# Patient Record
Sex: Female | Born: 1990 | Race: Black or African American | Hispanic: No | Marital: Single | Smoking: Never smoker
Health system: Southern US, Community
[De-identification: ages and names within clinical notes are randomized; demographics above are authoritative.]

---

## 2012-08-30 ENCOUNTER — Emergency Department (HOSPITAL_COMMUNITY)
Admission: EM | Admit: 2012-08-30 | Discharge: 2012-08-30 | Disposition: A | Discharge status: Home Health | Payer: No Typology Code available for payment source | Attending: Emergency Medicine | Admitting: Emergency Medicine

## 2012-08-30 ENCOUNTER — Emergency Department (HOSPITAL_COMMUNITY): Payer: No Typology Code available for payment source

## 2012-08-30 ENCOUNTER — Encounter (HOSPITAL_COMMUNITY): Payer: Self-pay | Admitting: Emergency Medicine

## 2012-08-30 DIAGNOSIS — Z79899 Other long term (current) drug therapy: Secondary | ICD-10-CM | POA: Insufficient documentation

## 2012-08-30 DIAGNOSIS — F411 Generalized anxiety disorder: Secondary | ICD-10-CM | POA: Insufficient documentation

## 2012-08-30 DIAGNOSIS — S161XXA Strain of muscle, fascia and tendon at neck level, initial encounter: Secondary | ICD-10-CM

## 2012-08-30 DIAGNOSIS — Y939 Activity, unspecified: Secondary | ICD-10-CM | POA: Insufficient documentation

## 2012-08-30 DIAGNOSIS — S139XXA Sprain of joints and ligaments of unspecified parts of neck, initial encounter: Secondary | ICD-10-CM | POA: Insufficient documentation

## 2012-08-30 MED ORDER — IBUPROFEN 800 MG PO TABS: 800.0000 mg | ORAL_TABLET | Freq: Three times a day (TID) | ORAL | Status: AC

## 2012-08-30 MED ORDER — CYCLOBENZAPRINE HCL 10 MG PO TABS: 10.0000 mg | ORAL_TABLET | Freq: Two times a day (BID) | ORAL | Status: AC | PRN

## 2012-08-30 NOTE — ED Notes (Signed)
Pt discharge to home with friends. Pt voiced understanding of discharge instructions. NAD.Marland KitchenMarland Kitchen

## 2012-08-30 NOTE — ED Provider Notes (Signed)
Medical screening examination/treatment/procedure(s) were performed by non-physician practitioner and as supervising physician I was immediately available for consultation/collaboration.   Loren Racer, MD 08/30/12 1245
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
## 2015-07-30 ENCOUNTER — Emergency Department (HOSPITAL_COMMUNITY)
Admission: EM | Admit: 2015-07-30 | Discharge: 2015-07-30 | Disposition: A | Discharge status: Home / Self Care | Payer: Federal, State, Local not specified - PPO | Source: Home / Self Care | Attending: Family Medicine | Admitting: Family Medicine

## 2015-07-30 ENCOUNTER — Encounter (HOSPITAL_COMMUNITY): Payer: Self-pay | Admitting: *Deleted

## 2015-07-30 DIAGNOSIS — R002 Palpitations: Secondary | ICD-10-CM | POA: Diagnosis not present

## 2015-07-30 NOTE — ED Provider Notes (Signed)
CSN: [id number]     Arrival date & time 07/30/15  1724 History   First MD Initiated Contact with Patient 07/30/15 1745     Chief Complaint  Patient presents with  . Anxiety   (Consider location/radiation/quality/duration/timing/severity/associated sxs/prior Treatment) Patient is a 24 y.o. female presenting with anxiety. The history is provided by the patient. No language interpreter was used.  Anxiety This is a recurrent problem. The current episode started more than 1 week ago. The problem occurs daily. The problem has not changed since onset.Associated symptoms include shortness of breath. Pertinent negatives include no chest pain and no abdominal pain. Nothing aggravates the symptoms.   Patient presents with vague complaint of sensation of palpitations, intermittent over the past 2 weeks. Occurs multiple times a day, usual duration is very brief (perhaps matter of seconds to few minutes). Accompanied by sensation in throat that is perhaps tightening, no shortness of breath or cough, no fevers or chills. Not associated with any particular situations or activities, has been able to work out on elliptical machine without problem.  No chest pain.   Nonsmoker. Drinks 1 bottle of wine twice a week. No other substances.  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 Candace Cruise).  History reviewed. No pertinent past medical history. History reviewed. No pertinent past surgical history. No family history on file. Social History  Substance Use Topics  . Smoking status: Never Smoker   . Smokeless tobacco: None  . Alcohol Use: Yes     Comment: occasional   OB History    No data available     Review of Systems  Constitutional: Negative for fever, chills, diaphoresis, activity change, appetite change and unexpected weight change.  Respiratory: Positive for shortness of breath. Negative for cough, chest tightness and wheezing.   Cardiovascular: Positive for palpitations. Negative for  chest pain.  Gastrointestinal: Negative for abdominal pain, diarrhea and abdominal distention.  Denies active depression, though says she does feel down sometimes. Aunt died yesterday of cancer; no SI.  No prior treatment for depression.   Allergies  Review of patient's allergies indicates no known allergies.  Home Medications   Prior to Admission medications   Medication Sig Start Date End Date Taking? Authorizing Provider  cyclobenzaprine (FLEXERIL) 10 MG tablet Take 1 tablet (10 mg total) by mouth 2 (two) times daily as needed for muscle spasms. 08/30/12   Elpidio Anis, PA-C  ibuprofen (ADVIL,MOTRIN) 800 MG tablet Take 1 tablet (800 mg total) by mouth 3 (three) times daily. 08/30/12   Elpidio Anis, PA-C  norethindrone-ethinyl estradiol (BALZIVA) 0.4-35 MG-MCG tablet Take 1 tablet by mouth daily.    Historical Provider, MD   Meds Ordered and Administered this Visit  Medications - No data to display  BP 165/82 mmHg  Pulse 94  Temp(Src) 98.1 F (36.7 C) (Oral)  Resp 18  SpO2 98%  LMP 07/06/2015 (Approximate) No data found.   Physical Exam  Constitutional: She appears well-developed and well-nourished. No distress.  HENT:  Head: Normocephalic.  Skin: She is not diaphoretic.    ED Course  Procedures (including critical care time)  Labs Review Labs Reviewed - No data to display  Imaging Review No results found.   Visual Acuity Review  Right Eye Distance:   Left Eye Distance:   Bilateral Distance:    Right Eye Near:   Left Eye Near:    Bilateral Near:     EKG with NSR, early repolarization; varying P wave morphology.  MDM  No diagnosis found.    Barbaraann Barthel, MD 07/30/15 [phone]

## 2015-07-30 NOTE — Discharge Instructions (Signed)
It was a pleasure to see you today.   I recommend that you see your regular doctor for further evaluation and treatment.  I am giving you a copy of your EKG to share with your doctor in [place].   Keep a log of the times when you have the sensation, and what you are doing and how long the symptoms last.

## 2015-07-30 NOTE — ED Notes (Signed)
Pt states unable to describe sxs, but has had "a strange feeling" in distal throat and upper chest intermittently x approx 2 wks.  Occasionally feels like her heart is beating rapidly.  States not sure if it's anxiety, worried she may have cancer.  Pt very fidgety during triage exam.  Denies any sxs at present.
